# Patient Record
Sex: Male | Born: 1965 | Race: White | Hispanic: Yes | Marital: Married | Smoking: Current every day smoker
Health system: Southern US, Community
[De-identification: ages and names within clinical notes are randomized; demographics above are authoritative.]

---

## 2004-04-26 ENCOUNTER — Ambulatory Visit: Payer: Self-pay | Admitting: Internal Medicine

## 2009-02-07 ENCOUNTER — Emergency Department (HOSPITAL_COMMUNITY): Admission: EM | Admit: 2009-02-07 | Discharge: 2009-02-07 | Payer: Self-pay | Admitting: Emergency Medicine

## 2009-02-09 ENCOUNTER — Ambulatory Visit (HOSPITAL_COMMUNITY): Admission: RE | Admit: 2009-02-09 | Discharge: 2009-02-10 | Payer: Self-pay | Admitting: Orthopedic Surgery

## 2010-03-23 LAB — CBC
HCT: 39 % (ref 39.0–52.0)
Platelets: 182 10*3/uL (ref 150–400)
RBC: 4.11 MIL/uL — ABNORMAL LOW (ref 4.22–5.81)
RDW: 13.2 % (ref 11.5–15.5)

## 2010-03-23 LAB — COMPREHENSIVE METABOLIC PANEL
ALT: 47 U/L (ref 0–53)
AST: 61 U/L — ABNORMAL HIGH (ref 0–37)
Calcium: 8.8 mg/dL (ref 8.4–10.5)
Chloride: 103 mEq/L (ref 96–112)
GFR calc non Af Amer: >60 mL/min (ref >60)
Glucose, Bld: 93 mg/dL (ref 70–99)
Sodium: 135 mEq/L (ref 135–145)

## 2010-03-23 LAB — PROTIME-INR
INR: 1.03 (ref 0.00–1.49)
Prothrombin Time: 13.4 seconds (ref 11.6–15.2)

## 2017-01-14 ENCOUNTER — Encounter: Payer: Self-pay | Admitting: Physician Assistant

## 2017-01-22 ENCOUNTER — Encounter: Payer: Self-pay | Admitting: Physician Assistant

## 2017-01-22 ENCOUNTER — Ambulatory Visit: Payer: Self-pay | Admitting: Physician Assistant

## 2017-01-22 VITALS — BP 114/70 | HR 76 | Ht 67.0 in | Wt 143.6 lb

## 2017-01-22 DIAGNOSIS — T17308A Unspecified foreign body in larynx causing other injury, initial encounter: Secondary | ICD-10-CM

## 2017-01-22 MED ORDER — OMEPRAZOLE 20 MG PO CPDR: DELAYED_RELEASE_CAPSULE | ORAL | 3 refills | Status: AC

## 2017-01-22 NOTE — Patient Instructions (Addendum)
We have sent the following medications to your pharmacy for you to pick up at your convenience: [facility] [address] 1. Omeprazole 20 mg  You have been scheduled for a Bar[facility] Es[facility]ogram at [facility], [address]  on Thursday 01-24-2017 at 8:30 am. Please arrive at 8:15 am to your appointment for registration. Make certain not to have anything to eat or drink 3 hours prior to your test. If you need to reschedule for any reason, please contact radiology at [phone] to do so. ________________________  [facility] off of [address] Go to Radiology. They do have valet parking.   If you are age 53 or younger, your body mass index should be between 19-25. Your Body mass index is 22.49 kg/m. If this is out of the aformentioned range listed, please consider follow up with your Primary Care Provider.   __________________________________________ A barium swallow is an examination that concentrates on views of the esophagus. This tends to be a double contrast exam (barium and two liquids which, when combined, create a gas to distend the wall of the oesophagus) or single contrast (non-ionic iodine based). The study is usually tailored to your symptoms so a good history is essential. Attention is paid during the study to the form, structure and configuration of the esophagus, looking for functional disorders (such as aspiration, dysphagia, achalasia, motility and reflux) EXAMINATION You may be asked to change into a gown, depending on the type of swallow being performed. A radiologist and radiographer will perform the procedure. The radiologist will advise you of the type of contrast selected for your procedure and direct you during the exam. You will be asked to stand, sit or lie in several different positions and to hold a small amount of fluid in your mouth before being asked to swallow while the imaging is performed .In some instances you may be asked to swallow barium  coated marshmallows to assess the motility of a solid food bolus. The exam can be recorded as a digital or video fluoroscopy procedure. POST PROCEDURE It will take 1-2 days for the barium to pass through your system. To facilitate this, it is important, unless otherwise directed, to increase your fluids for the next 24-48hrs and to resume your normal diet.  This test typically takes about 30 minutes to perform. __________________________________________________________________________________

## 2017-01-22 NOTE — Progress Notes (Signed)
Subjective:    Patient ID: Adrian Ryan, male    DOB: 1965-12-27, 52 y.o.   MRN: [id number]  HPI Saket is a pleasant 52 year old Hispanic male, new to GI today self-referred, after a recent visit to an urgent care. His primary complaint is intermittent choking episodes. Patient speaks some English, his daughter accompanies him today and does some of the interpretation for him. Patient has apparently been having symptoms for the past 10-15 years, in very occasionally will have an episode that scares him because he says he can't breathe. He had a bad episode about 4 years ago stating that he woke up in the middle of the night with his sensation of choking, and inability to get his breath. He says this causes him to panic. Eventually he coughs etc. and symptoms resolved. He says these generally do not occur with eating or drinking and may occur very sporadically only with swallowing saliva. His last episode was several months ago. Earlier this month he had a similar episode with eating ice cream. He says when he took the last by CT had a sensation of fullness in his throat and tightness like something may be sticking and then felt strangled. He denies any dysphagia to solids or liquids, and no odynophagia. Appetite has been fine, weight has been stable no nausea and vomiting, no complaints of abdominal pain and No heartburn or indigestion. He is not aware of any sour brash type symptoms. Patient is a smoker. He is generally in good health, with no known chronic medical problems. He has not had any prior GI evaluation. Family history is negative for GI disease, specifically colon cancer polyps and esophageal cancer. He has not had any imaging. Patient states he has wondered whether her symptoms are related to reflux. He is also wondered whether in part psychological because he knows that he panics when he  starts having one of these episodes.  Review of Systems Pertinent positive and negative review of  systems were noted in the above HPI section.  All other review of systems was otherwise negative.  Outpatient Encounter Medications as of 01/22/2017  Medication Sig  . omeprazole (PRILOSEC) 20 MG capsule Take 1 table by mouth with dinner daily.   No facility-administered encounter medications on file as of 01/22/2017.    No Known Allergies There are no active problems to display for this patient.  Social History   Socioeconomic History  . Marital status: Married    Spouse name: Not on file  . Number of children: 2  . Years of education: Not on file  . Highest education level: Not on file  Social Needs  . Financial resource strain: Not on file  . Food insecurity - worry: Not on file  . Food insecurity - inability: Not on file  . Transportation needs - medical: Not on file  . Transportation needs - non-medical: Not on file  Occupational History  . Occupation: Holiday representative  Tobacco Use  . Smoking status: Current Every Day Smoker    Types: Cigarettes  . Smokeless tobacco: Never Used  Substance and Sexual Activity  . Alcohol use: Yes  . Drug use: No  . Sexual activity: Not on file  Other Topics Concern  . Not on file  Social History Narrative  . Not on file    Mr. Bello's family history includes Heart disease in his father.      Objective:    Vitals:   01/22/17 0836  BP: 114/70  Pulse: 76  Physical Exam well-developed Hispanic male in no acute distress, pleasant accompanied by his daughter who interprets for him. Blood pressure 114/70 pulse 76, height 5 foot 7, weight 143, BMI 22.4. HEENT;nontraumatic normocephalic EOMI PERRLA sclera anicteric, Cardiovascular ;regular rate and rhythm with S1-S2, Pulmonary; clear he has a few scattered wheezes, Abdomen ;soft ,nontender nondistended bowel sounds are active there is no palpable mass or hepatosplenomegaly, Rectal ;exam not done, Extremities; no clubbing cyanosis or edema skin warm and dry, Neuropsych; mood and affect  appropriate       Assessment & Plan:   #271 52 year old Hispanic male with long history of infrequent episodes of choking which generally occurs independent of eating or drinking, and has awakened him from sleep on a couple of occasions. He may be having reflux with aspiration, versus simple aspiration. He does not have any symptoms of dysphagia or odynophagia.  #2 colon cancer surveillance-patient has not had any prior screening, family history is negative as far as he is aware. He is not interested in screening at this time.  #3 smoker  Plan; I discussed an antireflux regimen with the patient and his daughter, including an antireflux diet, nothing by mouth for 2-3 hours prior to bedtime and advised he sleep with his back elevated at least 45. We discussed options for evaluation including barium swallow versus upper endoscopy. I think with his symptoms barium swallow will be helpful, and we'll proceed with that.  Start omeprazole 20 mg by mouth before meals dinner. Prescription was sent. Further plans pending findings of barium swallow. He may benefit from an ENT evaluation, dependent on findings. Patient voiced understanding of the plan, and seemed pleased with this.  Amy S Esterwood PA-C 01/22/2017   Cc: No ref. provider found

## 2017-01-24 ENCOUNTER — Ambulatory Visit (HOSPITAL_COMMUNITY)
Admission: RE | Admit: 2017-01-24 | Discharge: 2017-01-24 | Disposition: A | Discharge status: Home / Self Care | Payer: Self-pay | Source: Ambulatory Visit | Attending: Physician Assistant | Admitting: Physician Assistant

## 2017-01-24 DIAGNOSIS — T17308A Unspecified foreign body in larynx causing other injury, initial encounter: Secondary | ICD-10-CM | POA: Insufficient documentation

## 2017-01-25 NOTE — Progress Notes (Signed)
Reviewed and agree with documentation and assessment and plan. K. Veena Croix Presley , MD   

## 2019-07-04 IMAGING — RF DG ESOPHAGUS
6 series · 14 of 15 positions shown · non-contrast
Comparison: None.

CLINICAL DATA: Choking initial encounter. Rule out aspiration, GERD

EXAM:
ESOPHOGRAM / BARIUM SWALLOW / BARIUM TABLET STUDY
TECHNIQUE: Combined double contrast and single contrast examination performed
using effervescent crystals, thick barium liquid, and thin barium
liquid. The patient was observed with fluoroscopy swallowing a 13 mm
barium sulphate tablet.
FLUOROSCOPY TIME:  Fluoroscopy Time:  1 minute 6 second
Radiation Exposure Index (if provided by the fluoroscopic device):
Number of Acquired Spot Images: 0

[Series 1: cp_standard · 0.34mm/px · 4 of 140 frames shown (1 of 6)]
[frame 19/140]
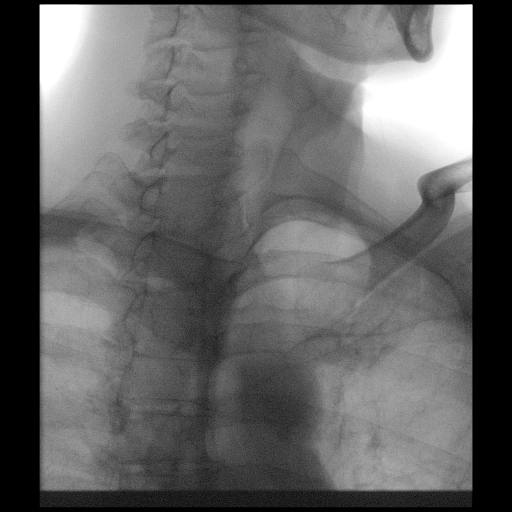
[frame 22/140]
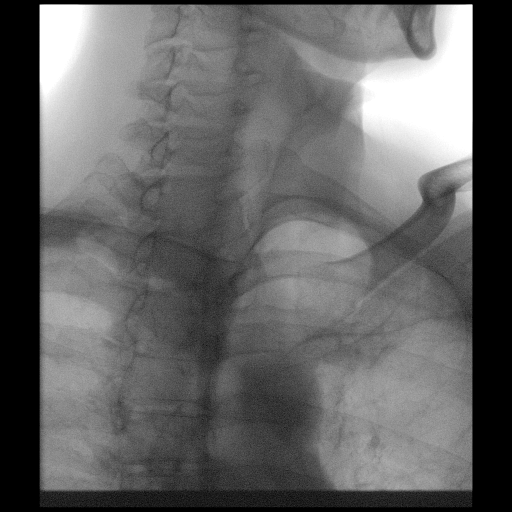
[frame 71/140]
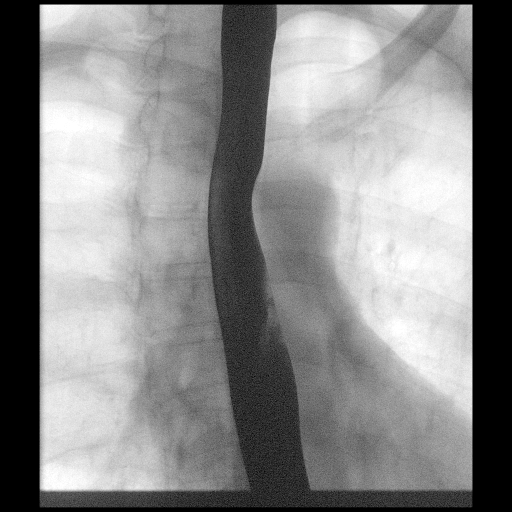
[frame 120/140]
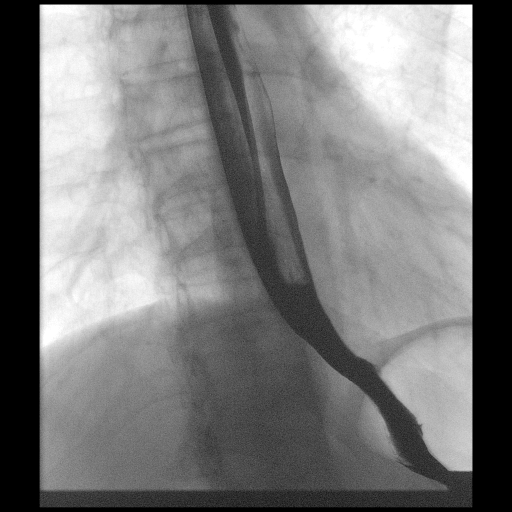

[Series 2: cp_standard · 0.17mm/px · 1 of 1 slices shown (2 of 6)]
[im 1/1]
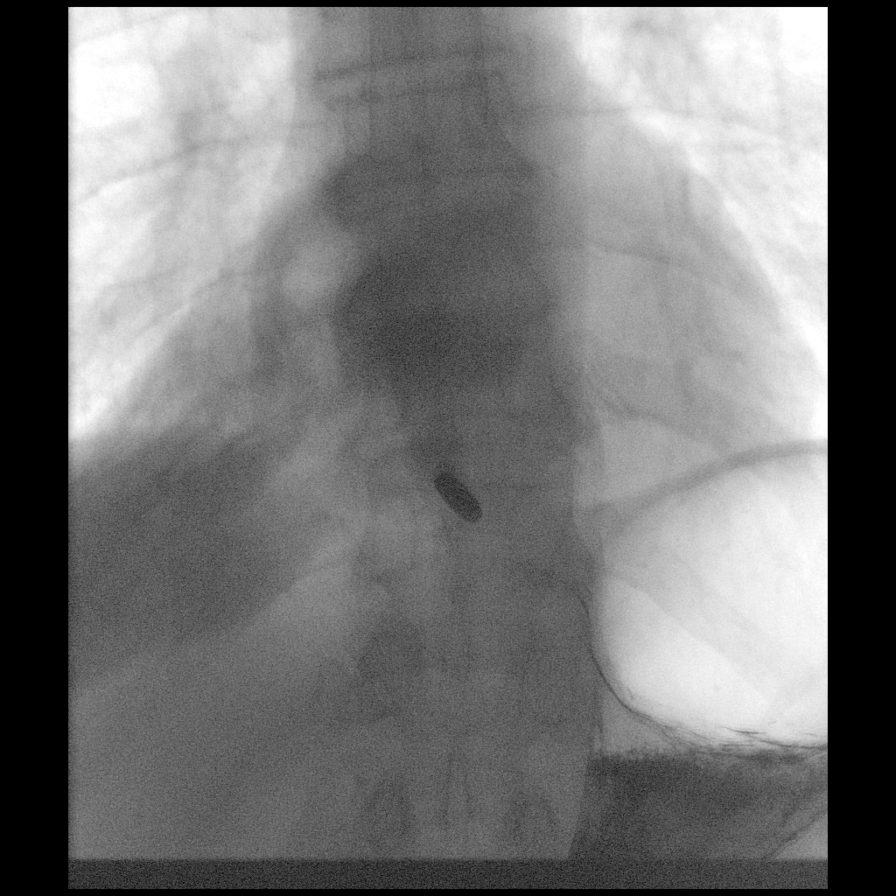

[Series 3: cp_standard · 0.17mm/px · 1 of 1 slices shown (3 of 6)]
[im 1/1]
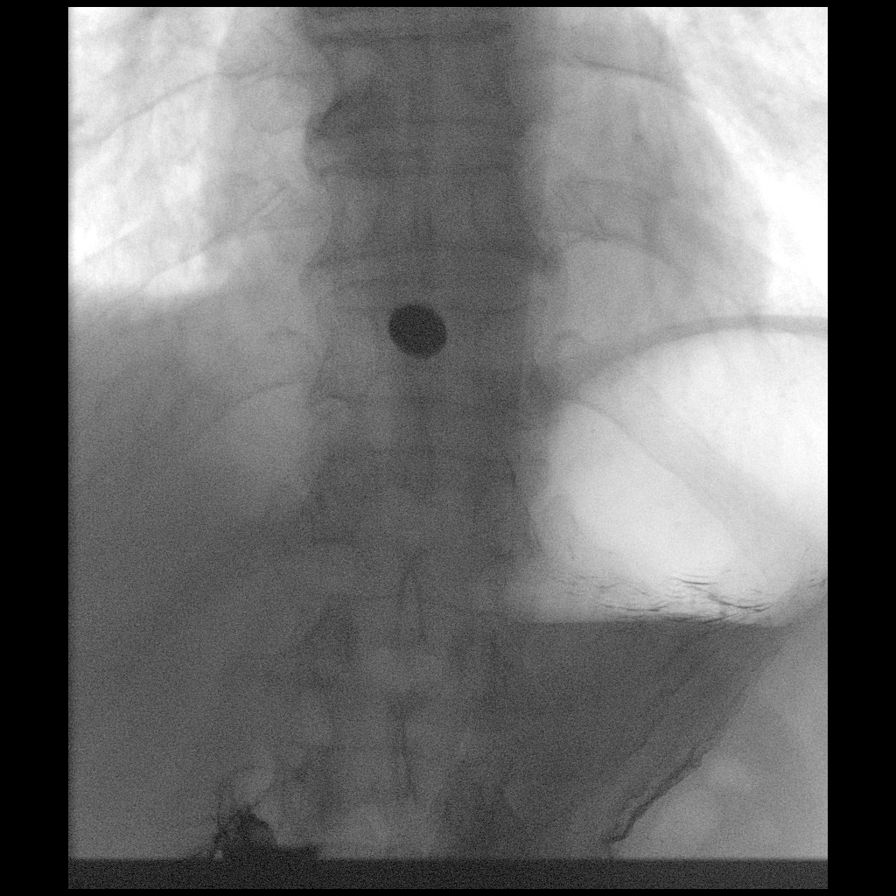

[Series 4: cp_standard · 0.17mm/px · 1 of 1 slices shown (4 of 6)]
[im 1/1]
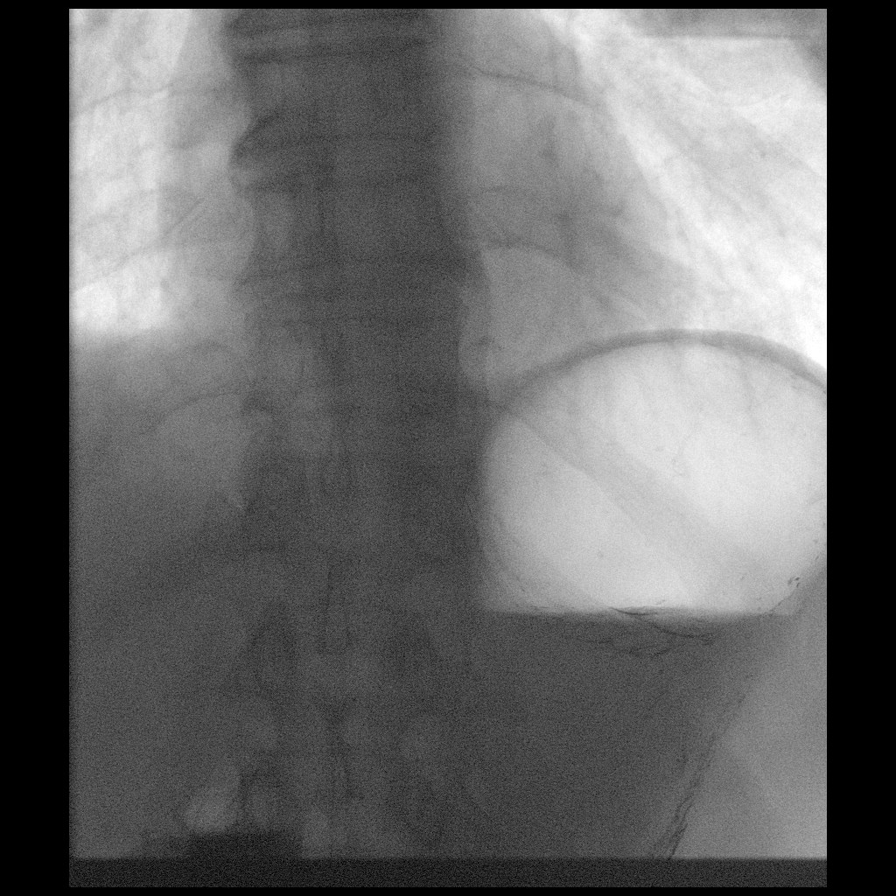

[Series 5: cp_standard · 0.37mm/px · 3 of 127 frames shown (5 of 6)]
[frame 33/127]
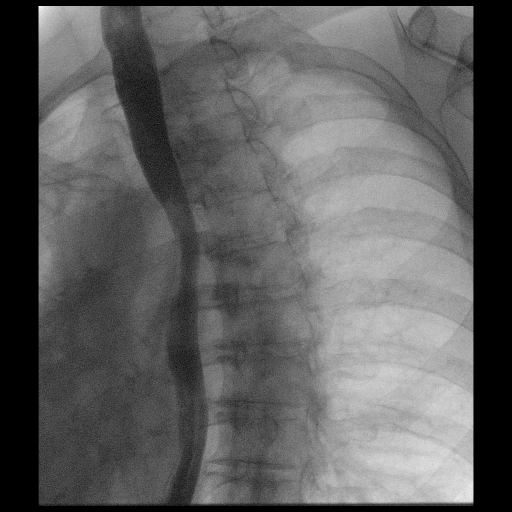
[frame 64/127]
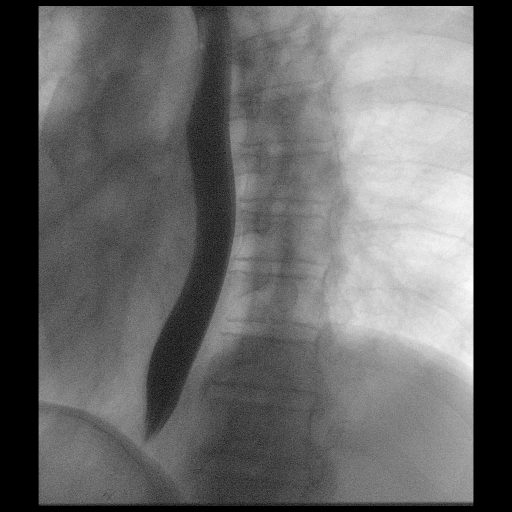
[frame 108/127]
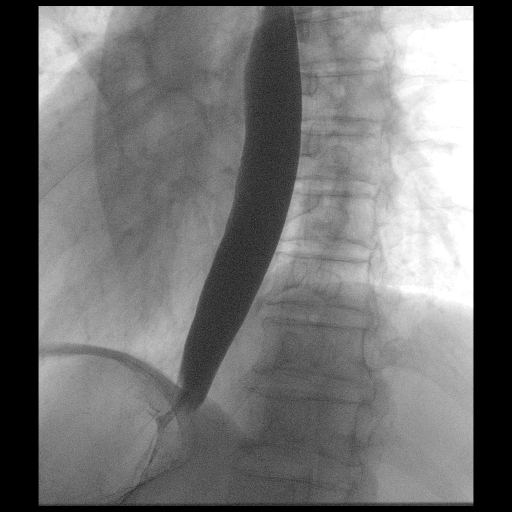

[Series 6: cp_standard · 0.37mm/px · 4 of 92 frames shown (6 of 6)]
[frame 14/92]
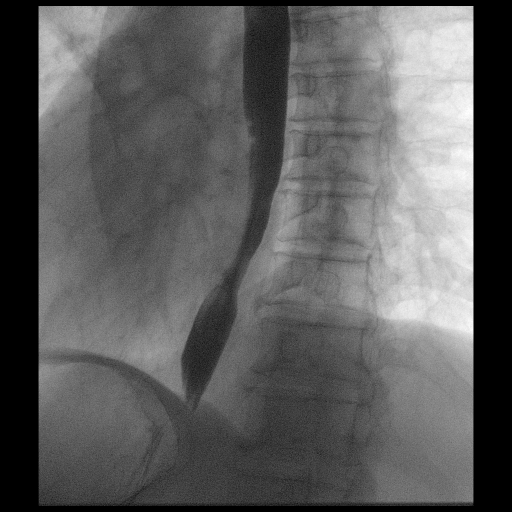
[frame 20/92]
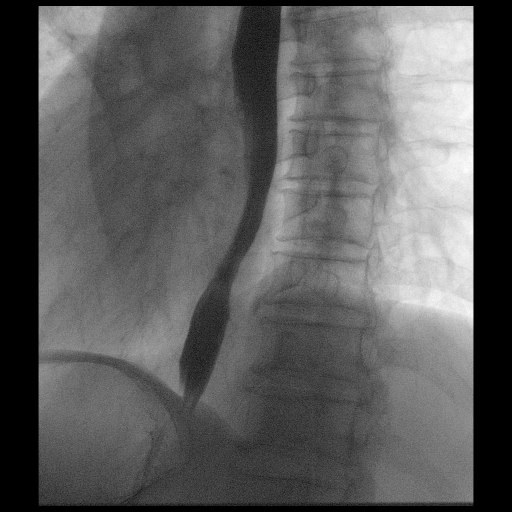
[frame 47/92]
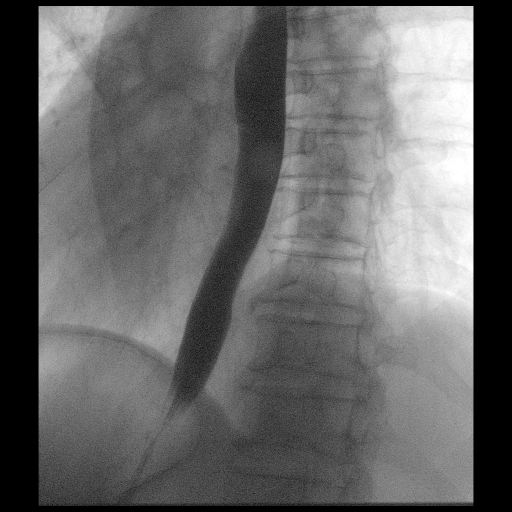
[frame 79/92]
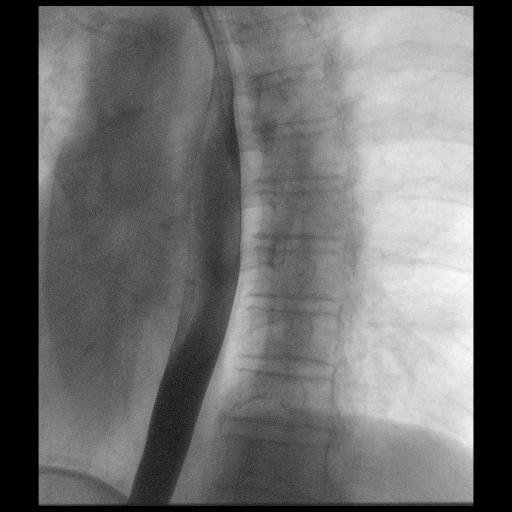

[14 of 15 positions shown; findings below may reference images not displayed]

FINDINGS: Esophageal motility normal. Esophageal mucosa and normal. No
stricture or mass. Barium tablet passed readily into the stomach.

Negative for hiatal hernia.  Negative for gastroesophageal reflux.
IMPRESSION: Negative
